# Patient Record
Sex: Female | Born: 1965 | Race: White | Hispanic: No | Marital: Single | Smoking: Former smoker
Health system: Southern US, Community
[De-identification: ages and names within clinical notes are randomized; demographics above are authoritative.]

---

## 2007-05-05 ENCOUNTER — Emergency Department: Payer: Self-pay | Admitting: Emergency Medicine

## 2007-05-05 ENCOUNTER — Other Ambulatory Visit: Payer: Self-pay

## 2009-12-24 ENCOUNTER — Emergency Department: Payer: Self-pay | Admitting: Emergency Medicine

## 2017-01-15 ENCOUNTER — Emergency Department
Admission: EM | Admit: 2017-01-15 | Discharge: 2017-01-15 | Disposition: A | Discharge status: Home / Self Care | Payer: BLUE CROSS/BLUE SHIELD | Attending: Emergency Medicine | Admitting: Emergency Medicine

## 2017-01-15 ENCOUNTER — Other Ambulatory Visit: Payer: Self-pay

## 2017-01-15 DIAGNOSIS — S99921A Unspecified injury of right foot, initial encounter: Secondary | ICD-10-CM | POA: Diagnosis present

## 2017-01-15 DIAGNOSIS — W269XXA Contact with unspecified sharp object(s), initial encounter: Secondary | ICD-10-CM | POA: Diagnosis not present

## 2017-01-15 DIAGNOSIS — S90411A Abrasion, right great toe, initial encounter: Secondary | ICD-10-CM | POA: Diagnosis not present

## 2017-01-15 DIAGNOSIS — Y999 Unspecified external cause status: Secondary | ICD-10-CM | POA: Insufficient documentation

## 2017-01-15 DIAGNOSIS — T148XXA Other injury of unspecified body region, initial encounter: Secondary | ICD-10-CM

## 2017-01-15 DIAGNOSIS — Y92513 Shop (commercial) as the place of occurrence of the external cause: Secondary | ICD-10-CM | POA: Diagnosis not present

## 2017-01-15 DIAGNOSIS — Y9389 Activity, other specified: Secondary | ICD-10-CM | POA: Insufficient documentation

## 2017-01-15 DIAGNOSIS — Z23 Encounter for immunization: Secondary | ICD-10-CM | POA: Diagnosis not present

## 2017-01-15 MED ORDER — TETANUS-DIPHTH-ACELL PERTUSSIS 5-2.5-18.5 LF-MCG/0.5 IM SUSP
0.5000 mL | Freq: Once | INTRAMUSCULAR | Status: AC
  Administered 2017-01-15: 0.5 mL via INTRAMUSCULAR
  Filled 2017-01-15: qty 0.5

## 2017-01-15 NOTE — ED Provider Notes (Signed)
[facility] Emergency Department Provider Note  ____________________________________________  Time seen: Approximately 10:18 PM  I have reviewed the triage vital signs and the nursing notes.   HISTORY  Chief Complaint Toe Pain    HPI Nicole SchwalbeSandra Davis is a 51 y.o. female presents to the emergency department with concern as patient sustained an abrasion while getting a pedicure.  She denies radiculopathy, weakness or changes in sensation of the lower extremities.  No alleviating measures have been attempted.   No past medical history on file.  There are no active problems to display for this patient.   No past surgical history on file.  Prior to Admission medications   Not on File    Allergies Patient has no known allergies.  No family history on file.  Social History Social History   Tobacco Use  . Smoking status: Not on file  Substance Use Topics  . Alcohol use: Not on file  . Drug use: Not on file     Review of Systems  Constitutional: No fever/chills Eyes: No visual changes. No discharge ENT: No upper respiratory complaints. Cardiovascular: no chest pain. Respiratory: no cough. No SOB. Gastrointestinal: No abdominal pain.  No nausea, no vomiting.  No diarrhea.  No constipation. Genitourinary: Negative for dysuria. No hematuria Musculoskeletal: Negative for musculoskeletal pain. Skin: Patient has abrasion of lateral right great toe. Neurological: Negative for headaches, focal weakness or numbness.   ____________________________________________   PHYSICAL EXAM:  VITAL SIGNS: ED Triage Vitals  Enc Vitals Group     BP 01/15/17 2122 121/80     Pulse Rate 01/15/17 2122 76     Resp 01/15/17 2122 18     Temp 01/15/17 2122 97.8 F (36.6 C)     Temp Source 01/15/17 2122 Oral     SpO2 01/15/17 2122 97 %     Weight 01/15/17 2122 150 lb (68 kg)     Height 01/15/17 2122 5\' 7"  (1.702 m)     Head Circumference --      Peak Flow --       Pain Score 01/15/17 2121 3     Pain Loc --      Pain Edu? --      Excl. in GC? --      Constitutional: Alert and oriented. Well appearing and in no acute distress. Eyes: Conjunctivae are normal. PERRL. EOMI. Head: Atraumatic. Cardiovascular: Normal rate, regular rhythm. Normal S1 and S2.  Good peripheral circulation. Respiratory: Normal respiratory effort without tachypnea or retractions. Lungs CTAB. Good air entry to the bases with no decreased or absent breath sounds. Musculoskeletal: Full range of motion to all extremities. No gross deformities appreciated. Neurologic:  Normal speech and language. No gross focal neurologic deficits are appreciated.  Skin: Patient has 0.25 cm abrasion at the lateral aspect of the right great toe.  No surrounding cellulitis.  No ingrown toenail.  No edema visualized.  Palpable dorsalis pedis pulse bilaterally and symmetrically. Psychiatric: Mood and affect are normal. Speech and behavior are normal. Patient exhibits appropriate insight and judgement.   ____________________________________________   LABS (all labs ordered are listed, but only abnormal results are displayed)  Labs Reviewed - No data to display ____________________________________________  EKG   ____________________________________________  RADIOLOGY  No results found.  ____________________________________________    PROCEDURES  Procedure(s) performed:    Procedures    Medications  Tdap (BOOSTRIX) injection 0.5 mL (0.5 mLs Intramuscular Given 01/15/17 2239)     ____________________________________________   INITIAL IMPRESSION /  ASSESSMENT AND PLAN / ED COURSE  Pertinent labs & imaging results that were available during my care of the patient were reviewed by me and considered in my medical decision making (see chart for details).  Review of the [place] CSRS was performed in accordance of the [facility] prior to dispensing any controlled drugs.    Assessment and  plan Abrasion Patient presents to the emergency department with a right great toe abrasion after getting a pedicure.  Supportive measures were encouraged.  All patient questions were answered.   ____________________________________________  FINAL CLINICAL IMPRESSION(S) / ED DIAGNOSES  Final diagnoses:  Feared complaint without diagnosis      NEW MEDICATIONS STARTED DURING THIS VISIT:  ED Discharge Orders    None          This chart was dictated using voice recognition software/Dragon. Despite best efforts to proofread, errors can occur which can change the meaning. Any change was purely unintentional.    Orvil Feil, PA-C 01/15/17 2301    Sharman Cheek, MD 01/17/17 [phone]

## 2017-01-15 NOTE — ED Notes (Signed)
Reviewed d/c instructions, follow-up care with patient. Patient verbalized understanding.  

## 2017-01-15 NOTE — ED Triage Notes (Signed)
Pt had a pedicure yesterday, noticed that it was tender and hurting today. Pt states that they cut the edge of her skin yesterday

## 2018-01-19 ENCOUNTER — Other Ambulatory Visit: Payer: Self-pay

## 2018-01-19 ENCOUNTER — Emergency Department
Admission: EM | Admit: 2018-01-19 | Discharge: 2018-01-19 | Disposition: A | Discharge status: Home / Self Care | Payer: BC Managed Care – PPO | Attending: Emergency Medicine | Admitting: Emergency Medicine

## 2018-01-19 DIAGNOSIS — L03012 Cellulitis of left finger: Secondary | ICD-10-CM | POA: Insufficient documentation

## 2018-01-19 DIAGNOSIS — Z87891 Personal history of nicotine dependence: Secondary | ICD-10-CM | POA: Insufficient documentation

## 2018-01-19 DIAGNOSIS — M79645 Pain in left finger(s): Secondary | ICD-10-CM | POA: Diagnosis present

## 2018-01-19 MED ORDER — CEPHALEXIN 500 MG PO CAPS: 500.0000 mg | ORAL_CAPSULE | Freq: Three times a day (TID) | ORAL | 0 refills | Status: DC

## 2018-01-19 MED ORDER — BACITRACIN-NEOMYCIN-POLYMYXIN 400-5-5000 EX OINT
TOPICAL_OINTMENT | Freq: Once | CUTANEOUS | Status: AC
  Administered 2018-01-19: 1 via TOPICAL

## 2018-01-19 MED ORDER — BACITRACIN-NEOMYCIN-POLYMYXIN 400-5-5000 EX OINT
TOPICAL_OINTMENT | CUTANEOUS | Status: AC
  Administered 2018-01-19: 1 via TOPICAL
  Filled 2018-01-19: qty 1

## 2018-01-19 MED ORDER — CEPHALEXIN 500 MG PO CAPS
500.0000 mg | ORAL_CAPSULE | Freq: Once | ORAL | Status: AC
Start: 2018-01-19 — End: 2018-01-19
  Administered 2018-01-19: 500 mg via ORAL
  Filled 2018-01-19: qty 1

## 2018-01-19 NOTE — ED Triage Notes (Signed)
Patient to ED for fingernail on left hand, middle finger that has paronychia around the nail. Area is red and warm. Patient had nails "done" last Friday and has been sore for a week.

## 2018-01-19 NOTE — Discharge Instructions (Signed)
You are being treated for a cuticle infection. Take the antibiotic as directed. Use warm epsom water soaks to promote healing. Keep the wound clean and covered with a little antibiotic ointment. See Dr. Orland Jarred as needed. Return for signs of worsening infection.

## 2018-01-19 NOTE — ED Provider Notes (Signed)
[facility] Emergency Department Provider Note ____________________________________________  Time seen: 2131  I have reviewed the triage vital signs and the nursing notes.  HISTORY  Chief Complaint  Other (Paronychia )  HPI Nicole Davis is a 52 y.o. female presents to the ED accompanied by her parents, for evaluation of infection to the left middle finger. She recalls a manicure about a week earlier, when she sustained a cuticle laceration. She has noted increased fingertip tenderness, redness, and swelling since that time. She denies any fevers, chills, or purulent drainage.   History reviewed. No pertinent past medical history.  There are no active problems to display for this patient.  History reviewed. No pertinent surgical history.  Prior to Admission medications   Medication Sig Start Date End Date Taking? Authorizing Provider  cephALEXin (KEFLEX) 500 MG capsule Take 1 capsule (500 mg total) by mouth 3 (three) times daily. 01/19/18   Anyia Gierke, Charlesetta Ivory, PA-C    Allergies Patient has no known allergies.  No family history on file.  Social History Social History   Tobacco Use  . Smoking status: Former Smoker  Substance Use Topics  . Alcohol use: Yes    Alcohol/week: 1.0 standard drinks    Types: 1 Glasses of wine per week    Comment: Glass of wine a year  . Drug use: Never    Review of Systems  Constitutional: Negative for fever. Cardiovascular: Negative for chest pain. Respiratory: Negative for shortness of breath. Musculoskeletal: Negative for back pain. Skin: Negative for rash. Cuticle infection on the left middle finger. Neurological: Negative for headaches, focal weakness or numbness. ____________________________________________  PHYSICAL EXAM:  VITAL SIGNS: ED Triage Vitals  Enc Vitals Group     BP 01/19/18 2048 118/77     Pulse Rate 01/19/18 2048 69     Resp 01/19/18 2048 16     Temp 01/19/18 2048 97.8 F (36.6 C)      Temp Source 01/19/18 2048 Oral     SpO2 01/19/18 2048 98 %     Weight 01/19/18 2050 150 lb (68 kg)     Height 01/19/18 2050 5\' 7"  (1.702 m)     Head Circumference --      Peak Flow --      Pain Score 01/19/18 2050 5     Pain Loc --      Pain Edu? --      Excl. in GC? --     Constitutional: Alert and oriented. Well appearing and in no distress. Head: Normocephalic and atraumatic. Cardiovascular: Normal rate, regular rhythm. Normal distal pulses. Respiratory: Normal respiratory effort. No wheezes/rales/rhonchi. Musculoskeletal: Nontender with normal range of motion in all extremities.  Neurologic:  Normal gait without ataxia. Normal speech and language. No gross focal neurologic deficits are appreciated. Skin:  Skin is warm, dry and intact. No rash noted. Left middle finger with a small scab to the lateral cuticle. Local erythema and mild edema noted to the lateral finger. No focal collection of pus noted. No streaking or pulp edema noted.  ____________________________________________  PROCEDURES  Procedures Keflex 500 mg PO Neosporin ointment application ____________________________________________  INITIAL IMPRESSION / ASSESSMENT AND PLAN / ED COURSE  Patient with ED evaluation of infection to the left middle fingertip. Patient without a focal paronychia, likely progressive soft tissue infection. She will be discharged with a prescription for Keflex. Wound care instructions and return precautions were reviewed.  ____________________________________________  FINAL CLINICAL IMPRESSION(S) / ED DIAGNOSES  Final diagnoses:  Felon  of finger of left hand      Caoimhe Damron, Charlesetta Ivory, PA-C 01/19/18 2304    Arnaldo Natal, MD 01/19/18 [phone]

## 2018-12-11 ENCOUNTER — Other Ambulatory Visit: Payer: Self-pay | Admitting: Acute Care

## 2018-12-11 DIAGNOSIS — G35 Multiple sclerosis: Secondary | ICD-10-CM

## 2018-12-24 ENCOUNTER — Ambulatory Visit
Admission: RE | Admit: 2018-12-24 | Discharge: 2018-12-24 | Disposition: A | Discharge status: Home / Self Care | Payer: BC Managed Care – PPO | Source: Ambulatory Visit | Attending: Acute Care | Admitting: Acute Care

## 2018-12-24 ENCOUNTER — Other Ambulatory Visit: Payer: Self-pay

## 2018-12-24 DIAGNOSIS — G35 Multiple sclerosis: Secondary | ICD-10-CM | POA: Insufficient documentation

## 2018-12-24 MED ORDER — GADOBUTROL 1 MMOL/ML IV SOLN
6.0000 mL | Freq: Once | INTRAVENOUS | Status: AC | PRN
  Administered 2018-12-24: 6 mL via INTRAVENOUS

## 2019-01-22 ENCOUNTER — Other Ambulatory Visit: Payer: Self-pay

## 2019-01-22 DIAGNOSIS — Z20822 Contact with and (suspected) exposure to covid-19: Secondary | ICD-10-CM

## 2019-01-24 LAB — NOVEL CORONAVIRUS, NAA: SARS-CoV-2, NAA: NOT DETECTED

## 2019-01-25 ENCOUNTER — Telehealth: Payer: Self-pay

## 2019-01-25 NOTE — Telephone Encounter (Signed)
Patient called in to confirm her negative covid test result

## 2019-05-03 ENCOUNTER — Emergency Department
Admission: EM | Admit: 2019-05-03 | Discharge: 2019-05-03 | Disposition: A | Discharge status: Home / Self Care | Payer: BC Managed Care – PPO | Attending: Emergency Medicine | Admitting: Emergency Medicine

## 2019-05-03 ENCOUNTER — Other Ambulatory Visit: Payer: Self-pay

## 2019-05-03 ENCOUNTER — Emergency Department: Payer: BC Managed Care – PPO

## 2019-05-03 ENCOUNTER — Encounter: Payer: Self-pay | Admitting: Emergency Medicine

## 2019-05-03 DIAGNOSIS — Y9301 Activity, walking, marching and hiking: Secondary | ICD-10-CM | POA: Insufficient documentation

## 2019-05-03 DIAGNOSIS — Y999 Unspecified external cause status: Secondary | ICD-10-CM | POA: Diagnosis not present

## 2019-05-03 DIAGNOSIS — G44309 Post-traumatic headache, unspecified, not intractable: Secondary | ICD-10-CM | POA: Insufficient documentation

## 2019-05-03 DIAGNOSIS — Z87891 Personal history of nicotine dependence: Secondary | ICD-10-CM | POA: Diagnosis not present

## 2019-05-03 DIAGNOSIS — S8001XA Contusion of right knee, initial encounter: Secondary | ICD-10-CM | POA: Diagnosis not present

## 2019-05-03 DIAGNOSIS — S0081XA Abrasion of other part of head, initial encounter: Secondary | ICD-10-CM | POA: Insufficient documentation

## 2019-05-03 DIAGNOSIS — Y92511 Restaurant or cafe as the place of occurrence of the external cause: Secondary | ICD-10-CM | POA: Diagnosis not present

## 2019-05-03 DIAGNOSIS — S0990XA Unspecified injury of head, initial encounter: Secondary | ICD-10-CM | POA: Diagnosis present

## 2019-05-03 DIAGNOSIS — S0083XA Contusion of other part of head, initial encounter: Secondary | ICD-10-CM | POA: Diagnosis not present

## 2019-05-03 DIAGNOSIS — W19XXXA Unspecified fall, initial encounter: Secondary | ICD-10-CM

## 2019-05-03 DIAGNOSIS — W01198A Fall on same level from slipping, tripping and stumbling with subsequent striking against other object, initial encounter: Secondary | ICD-10-CM | POA: Insufficient documentation

## 2019-05-03 MED ORDER — TRAMADOL HCL 50 MG PO TABS: 50.0000 mg | ORAL_TABLET | Freq: Four times a day (QID) | ORAL | 0 refills | Status: AC | PRN

## 2019-05-03 MED ORDER — BACITRACIN ZINC 500 UNIT/GM EX OINT
TOPICAL_OINTMENT | Freq: Once | CUTANEOUS | Status: AC
  Administered 2019-05-03: 1 via TOPICAL
  Filled 2019-05-03: qty 0.9

## 2019-05-03 MED ORDER — CEPHALEXIN 500 MG PO CAPS: 1000.0000 mg | ORAL_CAPSULE | Freq: Two times a day (BID) | ORAL | 0 refills | Status: AC

## 2019-05-03 NOTE — ED Provider Notes (Signed)
[facility] Emergency Department Provider Note  ____________________________________________  Time seen: Approximately 7:39 PM  I have reviewed the triage vital signs and the nursing notes.   HISTORY  Chief Complaint Fall    HPI Nicole Davis is a 54 y.o. female who presents the emergency department after mechanical fall.  Patient was walking in [place] [employer], tripped, fell striking her face, right hand, right knee.  Patient is unsure what she tripped on.  No loss of consciousness.  Patient is currently complaining of facial pain, right cheek pain, right hand abrasion, right knee pain.  Patient has a slight headache but no visual changes, no numbness or tingling.  Patient denies any neck pain, chest pain, shortness of breath abdominal pain, nausea or vomiting.  No medications prior to arrival.  Patient was unsure of her last tetanus shot, however on review of medical records Tdap was performed in November 2018.         History reviewed. No pertinent past medical history.  There are no problems to display for this patient.   History reviewed. No pertinent surgical history.  Prior to Admission medications   Medication Sig Start Date End Date Taking? Authorizing Provider  cephALEXin (KEFLEX) 500 MG capsule Take 2 capsules (1,000 mg total) by mouth 2 (two) times daily. 05/03/19   Savanna Dooley, Delorise Royals, PA-C  traMADol (ULTRAM) 50 MG tablet Take 1 tablet (50 mg total) by mouth every 6 (six) hours as needed. 05/03/19   Malyah Ohlrich, Delorise Royals, PA-C    Allergies Patient has no known allergies.  No family history on file.  Social History Social History   Tobacco Use  . Smoking status: Former Games developer  . Smokeless tobacco: Never Used  Substance Use Topics  . Alcohol use: Yes    Alcohol/week: 1.0 standard drinks    Types: 1 Glasses of wine per week    Comment: Glass of wine a year  . Drug use: Never     Review of Systems  Constitutional: No  fever/chills Eyes: No visual changes. No discharge ENT: No upper respiratory complaints. Cardiovascular: no chest pain. Respiratory: no cough. No SOB. Gastrointestinal: No abdominal pain.  No nausea, no vomiting.  No diarrhea.  No constipation. Musculoskeletal: Facial injury, right knee injury Skin: Abrasions to the right face, right hand, right knee Neurological: Positive for headache but denies focal weakness or numbness. 10-point ROS otherwise negative.  ____________________________________________   PHYSICAL EXAM:  VITAL SIGNS: ED Triage Vitals  Enc Vitals Group     BP 05/03/19 1931 129/74     Pulse Rate 05/03/19 1931 68     Resp 05/03/19 1931 18     Temp 05/03/19 1931 97.8 F (36.6 C)     Temp Source 05/03/19 1931 Oral     SpO2 05/03/19 1931 99 %     Weight 05/03/19 1928 145 lb (65.8 kg)     Height 05/03/19 1928 5\' 7"  (1.702 m)     Head Circumference --      Peak Flow --      Pain Score 05/03/19 1928 10     Pain Loc --      Pain Edu? --      Excl. in GC? --      Constitutional: Alert and oriented. Well appearing and in no acute distress. Eyes: Conjunctivae are normal. PERRL. EOMI. Head: Edema of the nose, abrasions of the right forehead and cheek.  Patient does have mild ecchymosis, edema along the right cheek.  Palpation  of the skull reveals no tenderness or palpable abnormality.  Patient is tender to palpation along the right psychometric process, inferior orbit, right frontal bone, nasal bridge.  No palpable abnormalities or crepitus.  No subcutaneous emphysema. ENT:      Ears:       Nose: No congestion/rhinnorhea.      Mouth/Throat: Mucous membranes are moist.  Neck: No stridor.  No cervical spine tenderness to palpation.  Cardiovascular: Normal rate, regular rhythm. Normal S1 and S2.  Good peripheral circulation. Respiratory: Normal respiratory effort without tachypnea or retractions. Lungs CTAB. Good air entry to the bases with no decreased or absent breath  sounds. Musculoskeletal: Full range of motion to all extremities. No gross deformities appreciated.  Good range of motion to the right knee, patient is tender to palpation over the patella, lateral joint line.  No other significant tenderness to palpation.  No ballottement.  Varus, valgus, Lachman's is negative.  Dorsalis pedis pulses sensation intact distally. Neurologic:  Normal speech and language. No gross focal neurologic deficits are appreciated.  Cranial nerves II through XII grossly intact Skin:  Skin is warm, dry and intact. No rash noted.  Abrasion noted to the right forehead, right cheek, right hand, right knee.  No frank lacerations. Psychiatric: Mood and affect are normal. Speech and behavior are normal. Patient exhibits appropriate insight and judgement.   ____________________________________________   LABS (all labs ordered are listed, but only abnormal results are displayed)  Labs Reviewed - No data to display ____________________________________________  EKG   ____________________________________________  RADIOLOGY I personally viewed and evaluated these images as part of my medical decision making, as well as reviewing the written report by the radiologist.  CT Head Wo Contrast  Result Date: 05/03/2019 CLINICAL DATA:  Fall striking head with facial injuries. Mechanical fall. No loss of consciousness. Posttraumatic headache. Facial trauma Headache, post traumatic Fell, hit head, facial injuries EXAM: CT HEAD WITHOUT CONTRAST TECHNIQUE: Contiguous axial images were obtained from the base of the skull through the vertex without intravenous contrast. COMPARISON:  Brain MRI 12/24/2018 FINDINGS: Brain: No intracranial hemorrhage, mass effect, or midline shift. No hydrocephalus. The basilar cisterns are patent. No evidence of territorial infarct or acute ischemia. No extra-axial or intracranial fluid collection. Vascular: No hyperdense vessel or unexpected calcification. Skull: No  fracture or focal lesion. Sinuses/Orbits: Assessed on concurrent face CT, reported separately. Other: None. IMPRESSION: Negative head CT.  No evidence of acute injury. Electronically Signed   By: Keith Rake M.D.   On: 05/03/2019 20:20   CT Cervical Spine Wo Contrast  Result Date: 05/03/2019 CLINICAL DATA:  Fall striking head with facial injuries. Mechanical fall. No loss of consciousness. Posttraumatic headache. Polytrauma, critical, head/C-spine injury suspected EXAM: CT CERVICAL SPINE WITHOUT CONTRAST TECHNIQUE: Multidetector CT imaging of the cervical spine was performed without intravenous contrast. Multiplanar CT image reconstructions were also generated. COMPARISON:  None. FINDINGS: Alignment: Straightening of normal lordosis. No traumatic subluxation. Skull base and vertebrae: No acute fracture. Vertebral body heights are maintained. The dens and skull base are intact. Soft tissues and spinal canal: No prevertebral fluid or swelling. No visible canal hematoma. Disc levels: Mild disc space narrowing and endplate spurring at Z6-X0 and to a lesser extent C5-C6. Upper chest: Negative. Other: None. IMPRESSION: Straightening of normal lordosis may be positioning or muscle spasm. No fracture or traumatic subluxation of the cervical spine. Electronically Signed   By: Keith Rake M.D.   On: 05/03/2019 20:22   DG Knee Complete 4 Views  Right  Result Date: 05/03/2019 CLINICAL DATA:  Fall in [employer] parking lot with right knee pain. EXAM: RIGHT KNEE - COMPLETE 4+ VIEW COMPARISON:  None. FINDINGS: No evidence of fracture or dislocation. Mild patellofemoral but trace lateral tibiofemoral spurring. Joint spaces are preserved. Minimal joint effusion. Mild prepatellar soft tissue edema. IMPRESSION: No fracture or dislocation of the right knee. Minimal degenerative change with small joint effusion. Electronically Signed   By: Narda Rutherford M.D.   On: 05/03/2019 20:41   CT Maxillofacial Wo  Contrast  Result Date: 05/03/2019 CLINICAL DATA:  Fall striking head with facial injuries. Mechanical fall. No loss of consciousness. Posttraumatic headache. Facial trauma EXAM: CT MAXILLOFACIAL WITHOUT CONTRAST TECHNIQUE: Multidetector CT imaging of the maxillofacial structures was performed. Multiplanar CT image reconstructions were also generated. COMPARISON:  None. FINDINGS: Osseous: Zygomatic arches, nasal bone, and mandibles are intact. Nasal septum is midline. Temporomandibular joints are congruent with symmetric degenerative change. Orbits: No acute orbital fracture. Both orbits and globes are intact. Sinuses: No sinus fracture or fluid level. The paranasal sinuses are clear. The mastoid air cells are clear. Soft tissues: Negative. Limited intracranial: Assessed on concurrent head CT, reported separately. IMPRESSION: No facial bone fracture. Electronically Signed   By: Narda Rutherford M.D.   On: 05/03/2019 20:26    ____________________________________________    PROCEDURES  Procedure(s) performed:    Procedures    Medications  bacitracin ointment (has no administration in time range)     ____________________________________________   INITIAL IMPRESSION / ASSESSMENT AND PLAN / ED COURSE  Pertinent labs & imaging results that were available during my care of the patient were reviewed by me and considered in my medical decision making (see chart for details).  Review of the [place] CSRS was performed in accordance of the [facility] prior to dispensing any controlled drugs.           Patient's diagnosis is consistent with fall, facial contusion, facial abrasions, right knee contusion.  Patient presents emergency department after mechanical fall while walking in a [employer].  Patient did hit her head but did not lose consciousness.  On exam, concern for nasal fracture/facial injury.  Imaging is reassuring with no evidence of traumatic injuries.  Patient has wounds dressed here in  the emergency department.  Patient will be placed on antibiotics prophylactically.  Tetanus shot is up-to-date.  Follow-up with primary care..  Patient is given ED precautions to return to the ED for any worsening or new symptoms.     ____________________________________________  FINAL CLINICAL IMPRESSION(S) / ED DIAGNOSES  Final diagnoses:  Fall, initial encounter  Contusion of face, initial encounter  Abrasion of face, initial encounter  Contusion of right knee, initial encounter      NEW MEDICATIONS STARTED DURING THIS VISIT:  ED Discharge Orders         Ordered    traMADol (ULTRAM) 50 MG tablet  Every 6 hours PRN     05/03/19 2142    cephALEXin (KEFLEX) 500 MG capsule  2 times daily     05/03/19 2142              This chart was dictated using voice recognition software/Dragon. Despite best efforts to proofread, errors can occur which can change the meaning. Any change was purely unintentional.    Racheal Patches, PA-C 05/03/19 2143    Sharyn Creamer, MD 05/04/19 [phone]

## 2019-05-03 NOTE — ED Triage Notes (Signed)
Pt arrives POV to triage with c/o of mechanical fall. Pt denies LOC or use of anticoagulants. Pt is in NAD.

## 2019-05-03 NOTE — ED Notes (Signed)
Pt's abrasions cleaned, bacitracin and bandages applied.  Pt assisted to bathroom.

## 2019-05-03 NOTE — ED Notes (Signed)
Pt states she fell in the [employer] parking lot.  Facial abrasions noted, pt reports pain in right hand and right knee.

## 2019-05-03 NOTE — ED Notes (Signed)
Pt requested that her parents be found in the parking lot and updated; they do not have a cell phone.  Luanne with Patient Relations found and updated the parents.

## 2020-10-09 ENCOUNTER — Other Ambulatory Visit: Payer: Self-pay | Admitting: Family Medicine

## 2020-10-09 DIAGNOSIS — Z1231 Encounter for screening mammogram for malignant neoplasm of breast: Secondary | ICD-10-CM

## 2020-10-21 ENCOUNTER — Other Ambulatory Visit: Payer: Self-pay

## 2020-10-21 ENCOUNTER — Ambulatory Visit
Admission: RE | Admit: 2020-10-21 | Discharge: 2020-10-21 | Disposition: A | Discharge status: Home / Self Care | Payer: BLUE CROSS/BLUE SHIELD | Source: Ambulatory Visit | Attending: Family Medicine | Admitting: Family Medicine

## 2020-10-21 DIAGNOSIS — Z1231 Encounter for screening mammogram for malignant neoplasm of breast: Secondary | ICD-10-CM | POA: Diagnosis present

## 2020-10-23 ENCOUNTER — Inpatient Hospital Stay
Admission: RE | Admit: 2020-10-23 | Discharge: 2020-10-23 | Disposition: A | Discharge status: Home / Self Care | Payer: Self-pay | Source: Ambulatory Visit | Attending: *Deleted | Admitting: *Deleted

## 2020-10-23 ENCOUNTER — Other Ambulatory Visit: Payer: Self-pay | Admitting: *Deleted

## 2020-10-23 DIAGNOSIS — Z1231 Encounter for screening mammogram for malignant neoplasm of breast: Secondary | ICD-10-CM

## 2021-04-03 IMAGING — DX DG KNEE COMPLETE 4+V*R*
4 series · 4 of 4 positions shown · non-contrast
Comparison: None.

CLINICAL DATA: Fall in [REDACTED] parking lot with right knee
pain.

EXAM:
RIGHT KNEE - COMPLETE 4+ VIEW

[knee ap]
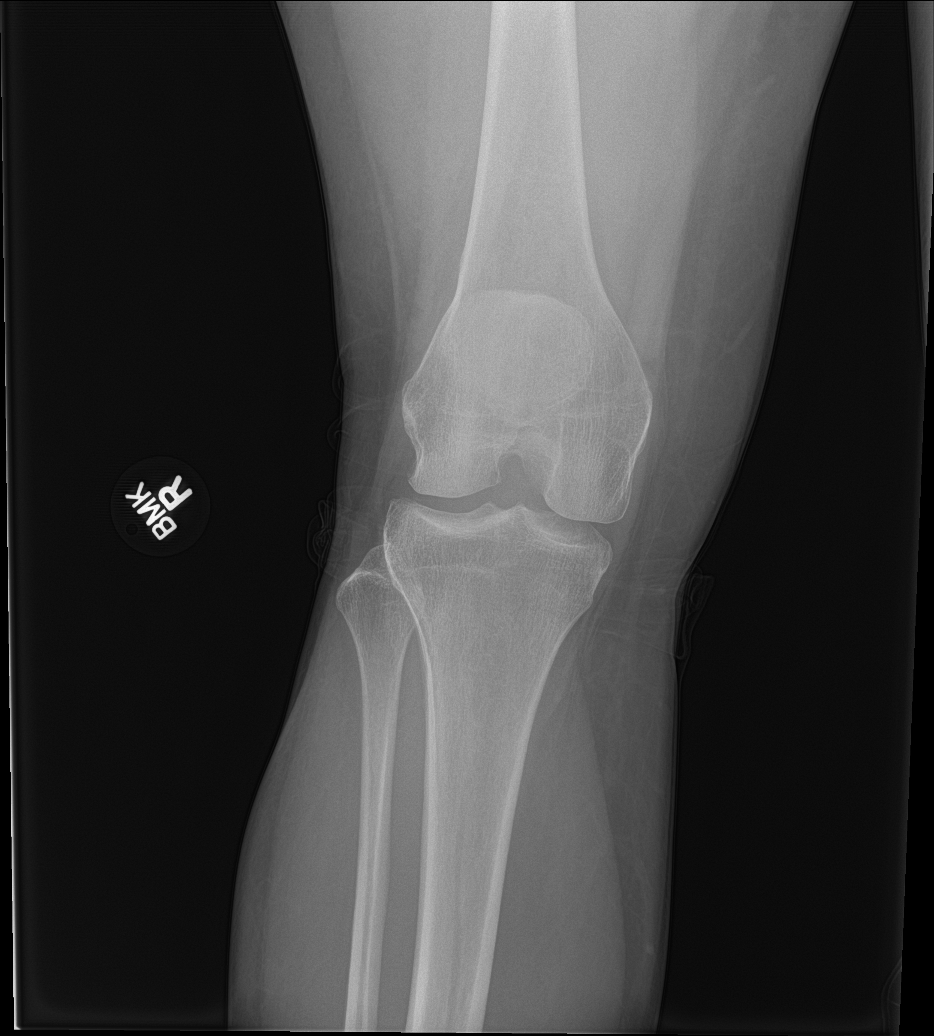

[knee lat]
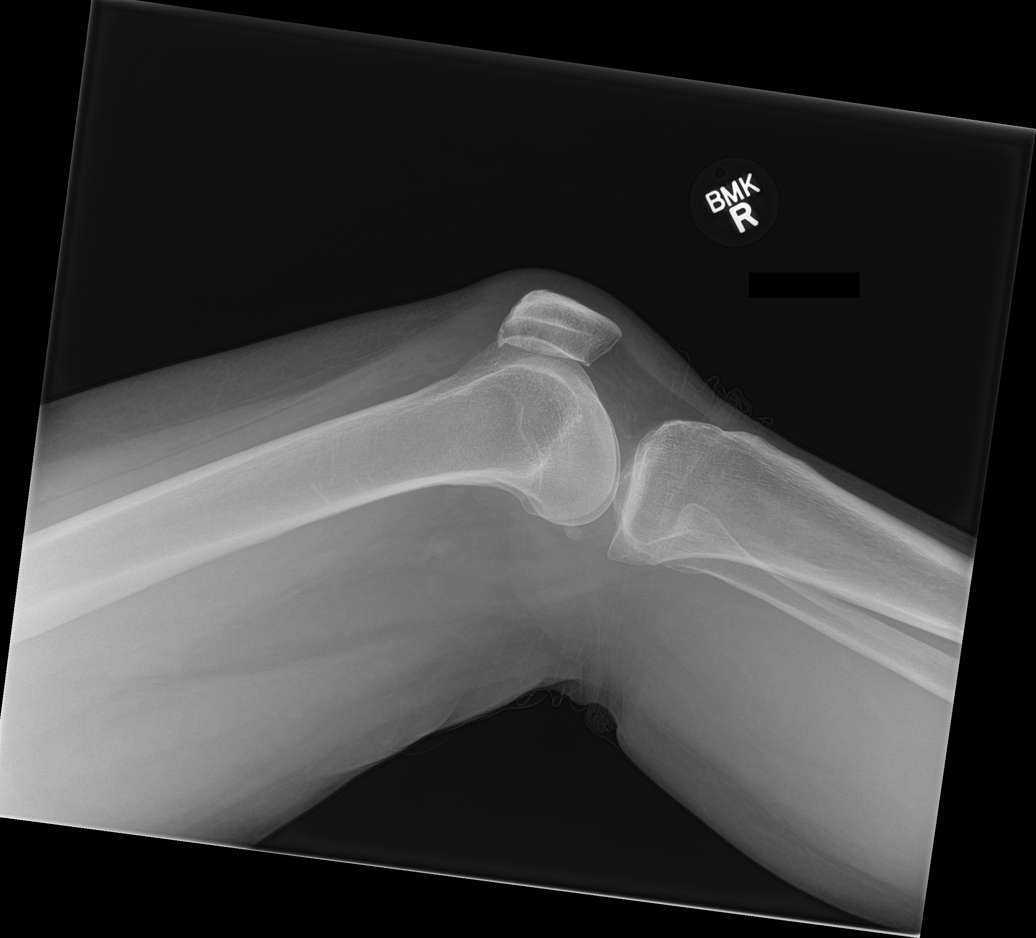

[knee obl (1 of 2)]
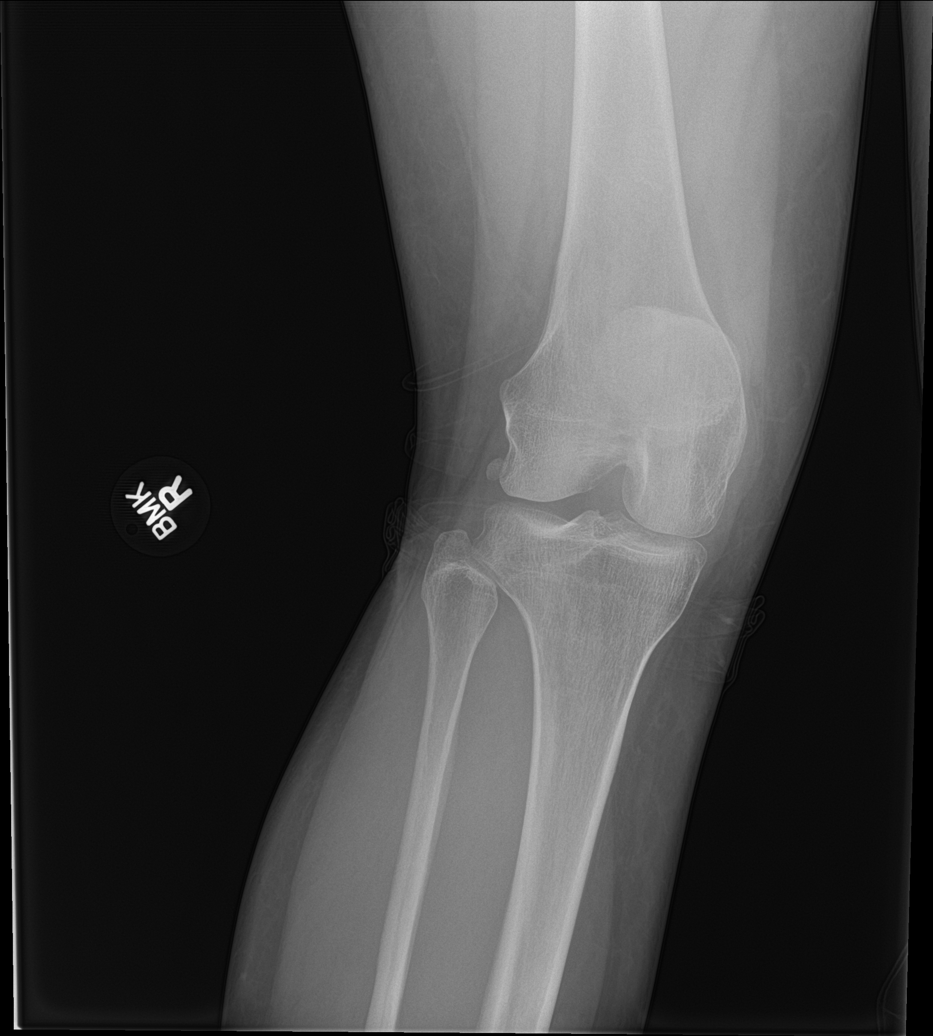

[knee obl (2 of 2)]
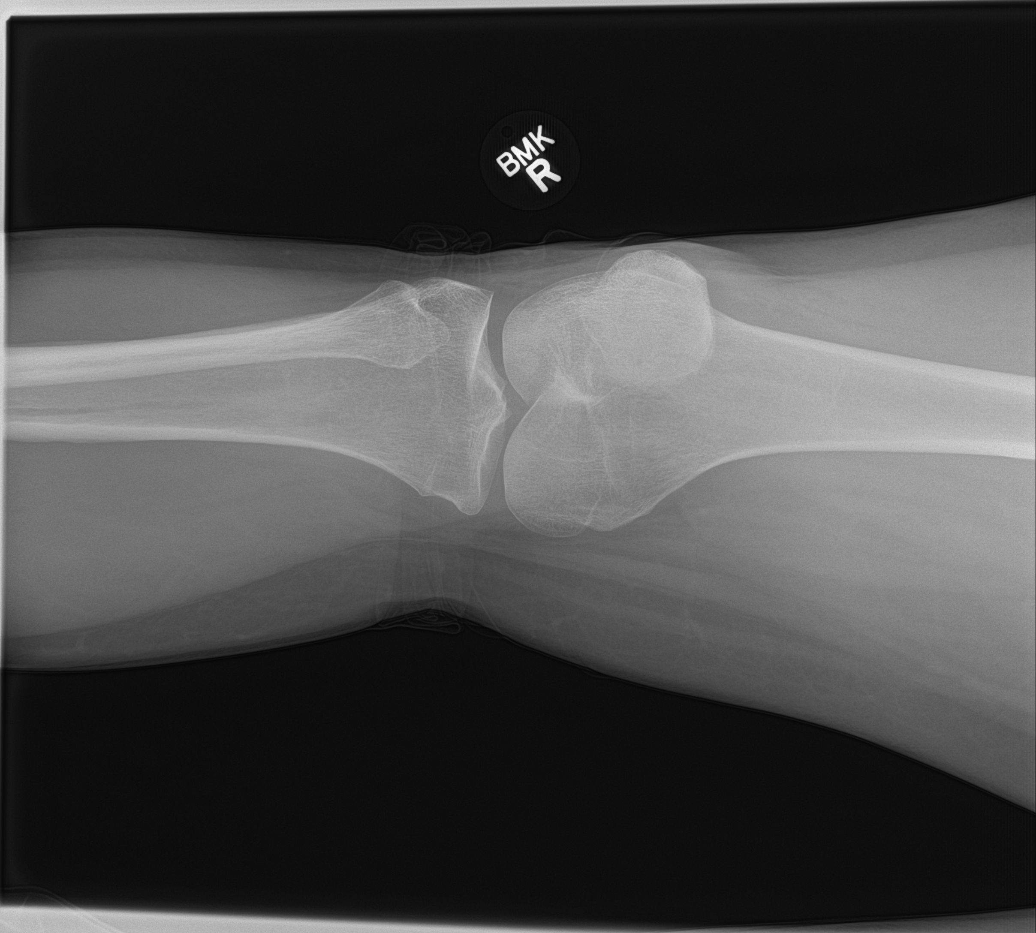

[4 of 4 positions shown; findings below may reference images not displayed]

FINDINGS: No evidence of fracture or dislocation. Mild patellofemoral but
trace lateral tibiofemoral spurring. Joint spaces are preserved.
Minimal joint effusion. Mild prepatellar soft tissue edema.
IMPRESSION: No fracture or dislocation of the right knee.

Minimal degenerative change with small joint effusion.

## 2021-04-03 IMAGING — CT CT CERVICAL SPINE W/O CM
3 of 4 series · 12 of 33 positions shown, 14 images · non-contrast
Comparison: None.

CLINICAL DATA: Fall striking head with facial injuries. Mechanical
fall. No loss of consciousness. Posttraumatic headache.

Polytrauma, critical, head/C-spine injury suspected
EXAM:
CT CERVICAL SPINE WITHOUT CONTRAST
TECHNIQUE: Multidetector CT imaging of the cervical spine was performed without
intravenous contrast. Multiplanar CT image reconstructions were also
generated.

[Series 4: sagittal bone · sagittal · 0.25mm/px · 5 of 52 slices shown, 6 images]
[im 18/52  bone]
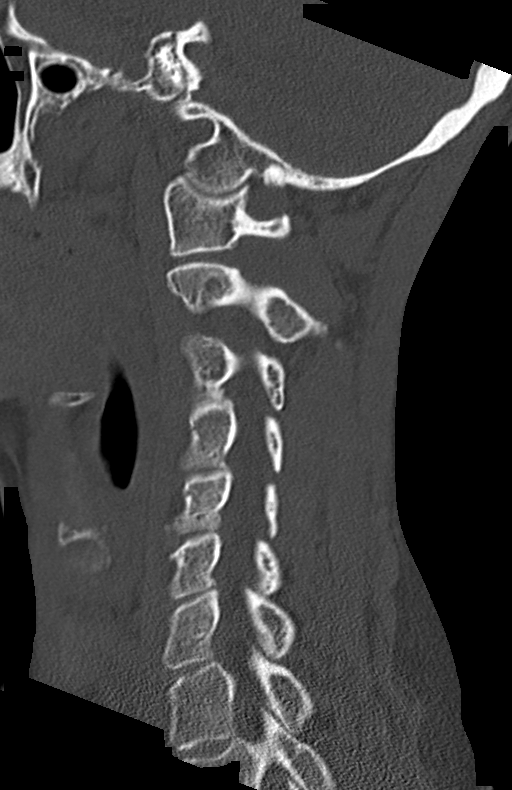
[im 22/52  bone]
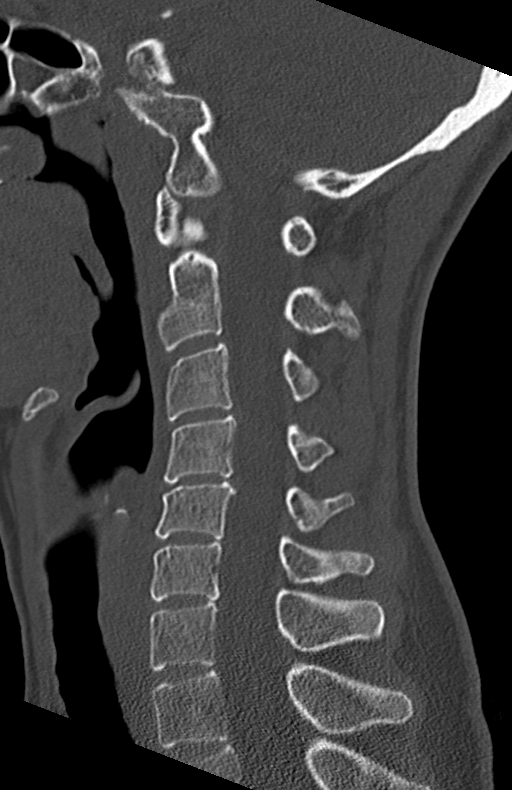
[im 26/52  soft-tissue]
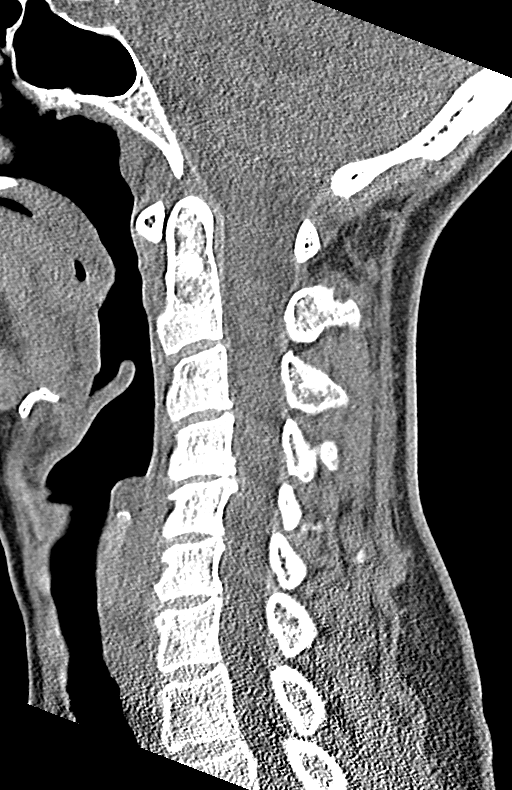
[im 26/52  bone]
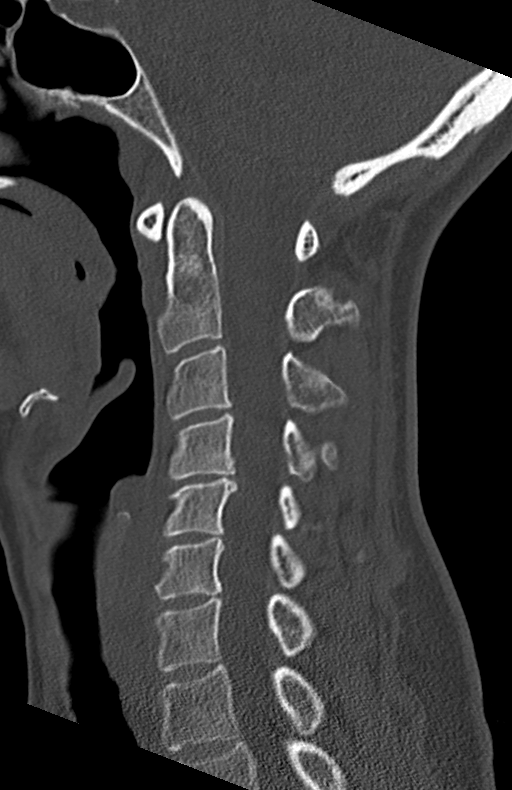
[im 30/52  bone]
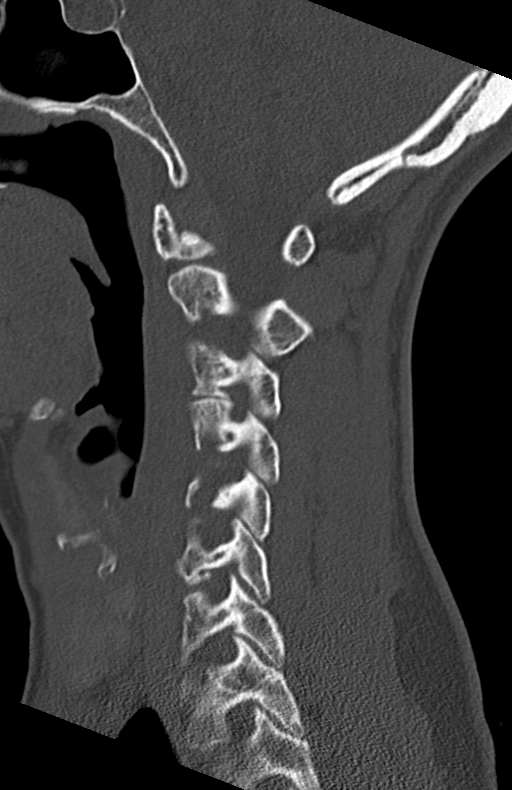
[im 35/52  bone]
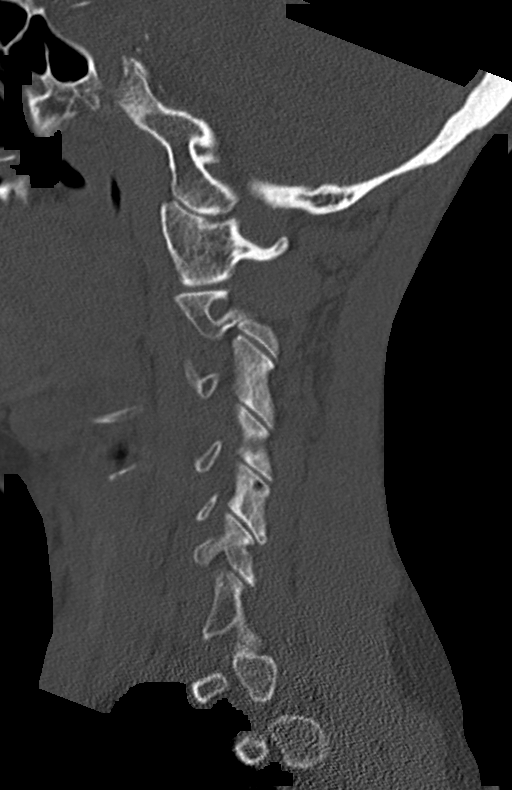

[Series 5: coronal bone · coronal · 0.21mm/px · 3 of 39 slices shown]
[im 8/39  bone]
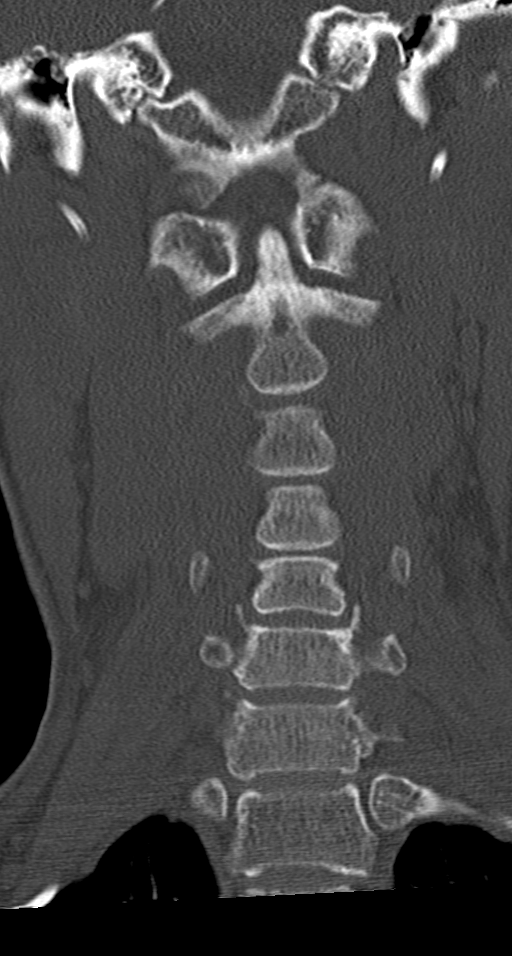
[im 16/39  bone]
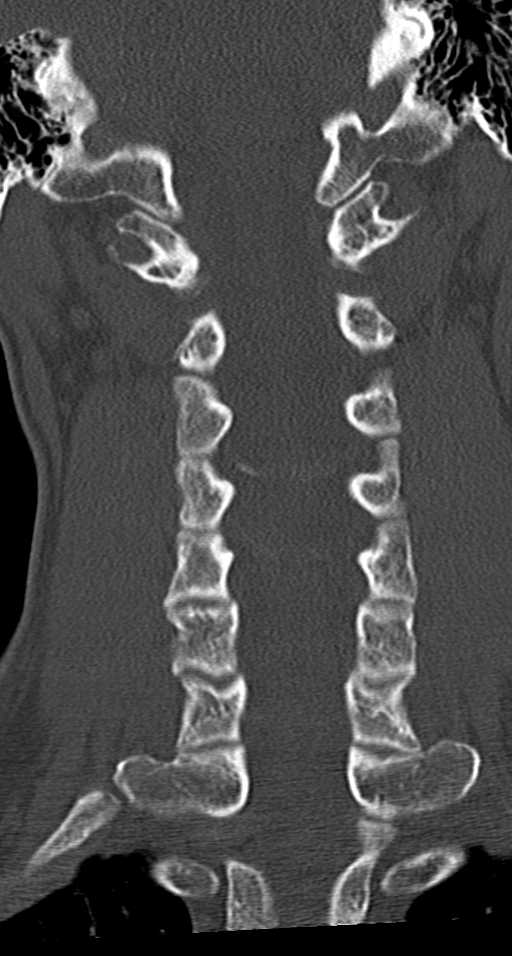
[im 23/39  bone]
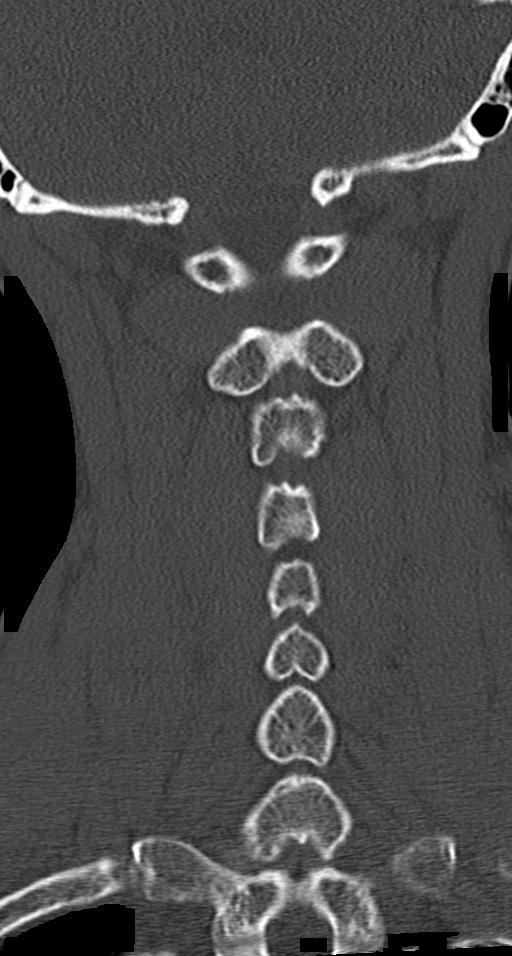

[Series 6: orthogonal bone · axial · 0.23mm/px · z∈[+255,+371]mm · 4 of 98 slices shown, 5 images]
[im 17/98  soft-tissue]
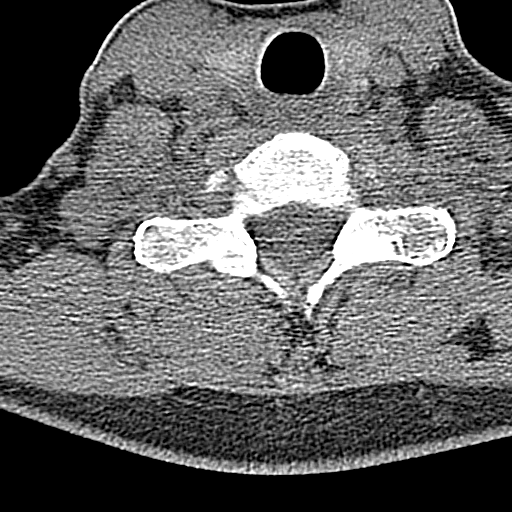
[im 17/98  bone]
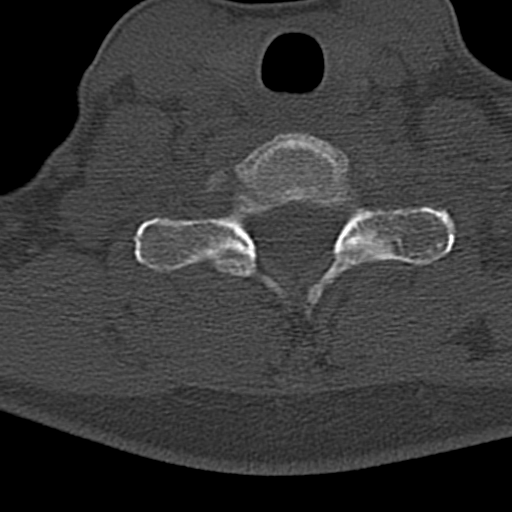
[im 33/98  bone]
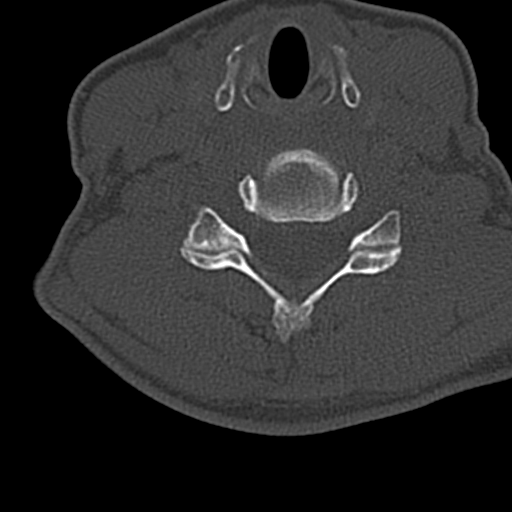
[im 65/98  bone]
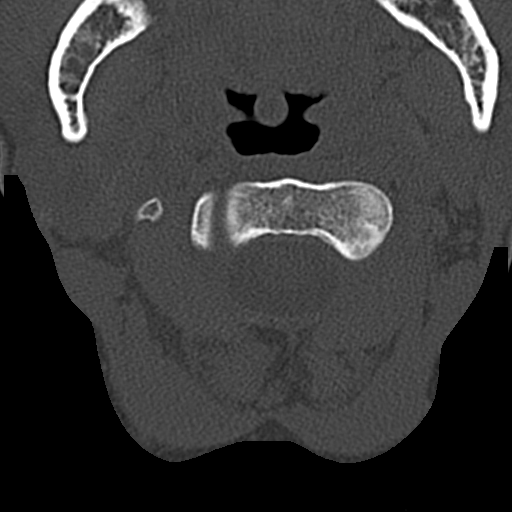
[im 81/98  bone]
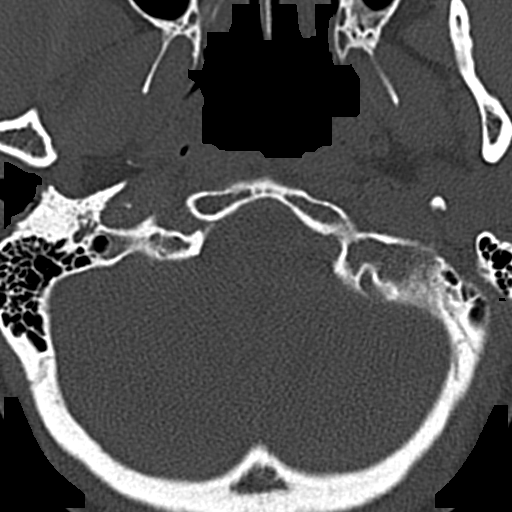

[12 of 33 positions shown; findings below may reference images not displayed]

FINDINGS: Alignment: Straightening of normal lordosis. No traumatic
subluxation.

Skull base and vertebrae: No acute fracture. Vertebral body heights
are maintained. The dens and skull base are intact.

Soft tissues and spinal canal: No prevertebral fluid or swelling. No
visible canal hematoma.

Disc levels: Mild disc space narrowing and endplate spurring at
C4-C5 and to a lesser extent C5-C6.

Upper chest: Negative.

Other: None.
IMPRESSION: Straightening of normal lordosis may be positioning or muscle spasm.
No fracture or traumatic subluxation of the cervical spine.

## 2023-07-03 ENCOUNTER — Ambulatory Visit: Payer: BLUE CROSS/BLUE SHIELD | Admitting: Gastroenterology

## 2023-07-14 ENCOUNTER — Telehealth: Payer: Self-pay

## 2023-07-14 NOTE — Telephone Encounter (Signed)
 The patient called requesting to schedule an appointment in June, stating that she does not believe she can wait until August to be seen. She explained that the reason for canceling her previous appointment was due to an important family matter.  The patient acknowledged that she is aware her next available appointment is currently scheduled for August. I informed her that we are unable to schedule any appointments for June, as our current providers are leaving the practice and the schedule for the new provider is not yet open.

## 2023-08-04 ENCOUNTER — Telehealth: Payer: Self-pay

## 2023-08-04 NOTE — Telephone Encounter (Signed)
 Pt has new pt refferal requesting call back

## 2023-09-11 ENCOUNTER — Encounter: Payer: Self-pay | Admitting: Physician Assistant

## 2023-11-03 ENCOUNTER — Ambulatory Visit: Payer: Self-pay | Admitting: Physician Assistant
